# Patient Record
Sex: Male | Born: 1995 | Race: Black or African American | Hispanic: No | Marital: Single | Smoking: Never smoker
Health system: Southern US, Community
[De-identification: ages and names within clinical notes are randomized; demographics above are authoritative.]

## PROBLEM LIST (undated history)

## (undated) DIAGNOSIS — J45909 Unspecified asthma, uncomplicated: Secondary | ICD-10-CM

## (undated) DIAGNOSIS — R040 Epistaxis: Secondary | ICD-10-CM

## (undated) DIAGNOSIS — Z789 Other specified health status: Secondary | ICD-10-CM

## (undated) DIAGNOSIS — N200 Calculus of kidney: Secondary | ICD-10-CM

## (undated) DIAGNOSIS — K5900 Constipation, unspecified: Secondary | ICD-10-CM

## (undated) HISTORY — DX: Other specified health status: Z78.9

## (undated) HISTORY — DX: Epistaxis: R04.0

## (undated) HISTORY — DX: Calculus of kidney: N20.0

## (undated) HISTORY — DX: Constipation, unspecified: K59.00

---
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
## 2017-01-05 ENCOUNTER — Encounter: Payer: Self-pay | Admitting: Dermatology

## 2017-01-05 ENCOUNTER — Ambulatory Visit: Payer: No Typology Code available for payment source | Attending: Oncology | Admitting: Dermatology

## 2017-01-05 VITALS — BP 118/72 | Ht 66.5 in | Wt 134.0 lb

## 2017-01-05 DIAGNOSIS — D229 Melanocytic nevi, unspecified: Secondary | ICD-10-CM

## 2017-01-05 DIAGNOSIS — L219 Seborrheic dermatitis, unspecified: Secondary | ICD-10-CM

## 2017-01-05 MED ORDER — FLUOCINOLONE ACETONIDE SCALP 0.01 % EX OIL *I*
TOPICAL_OIL | CUTANEOUS | 1 refills | Status: DC
Start: 2017-01-06 — End: 2017-06-21

## 2017-01-05 MED ORDER — KETOCONAZOLE 2 % EX SHAM *I*
MEDICATED_SHAMPOO | CUTANEOUS | 2 refills | Status: DC
Start: 2017-01-07 — End: 2017-06-21

## 2017-01-05 NOTE — Progress Notes (Addendum)
Referring Provider: No ref. provider found   PCP: Howell Pringle, MD     Chief complaint: New Patient Visit (dry scalp )    Derm History:   -cradle cap as an infant    HPI:   Mark Bautista is a pleasant 21 y.o. male accompanied by his mother here for the following:    Problem: scaling  Location: scalp  Duration: 1 year  Associated signs/symptoms: itchy, red, tender  Modifying factors (prior treatments/current treatment):   -head and shoulders shampoo - not helpful (will go away for about 1 day at most)  -nizoral shampoo - not helpful (will go away for about 1 day at most)  -endorses headaches   -no other areas of involvement of skin    Personal hx of skin cancer: no  Family hx: no family history of psoriasis  Social History: Never Smoker    ROS:   Constitutional: Denies fevers, chills, weight loss  Integumentary: No new or changing moles    Physical Exam:    Vitals:    01/05/17 1257   BP: 118/72   Weight: 60.8 kg (134 lb)   Height: 1.689 m (5' 6.5")      Pain    01/05/17 1257   PainSc:   0 - No pain      General: Awake and alert, NAD  Skin: All of the following were examined, and were within normal limits, except as noted: Face, Ears, Scalp/Hair, Eyes/Eyelids, Lips, Neck and Extremities (RUE/LUE)  -over the vertex scalp > parietal scalp there is thick white-yellow scaling with minimal underlying erythema; the scalp is diffusely tender to the touch  -over the central upper back is a brown patch with darker brown macules contained within  -scattered over the upper back are several hyperpigmented macules consistent with post inflammatory hyperpigmentation    Lymph nodes: no submental, submandibular, cervical, postauricular, supraclavicular lymphadenopathy    Assessment/Plan:   1. Dandruff/ minimalSeborrheic dermatitis - scalp   -diagnosis and treatment options discussed  -tenderness is not explained by skin findings today - no significant inflammation/erythema or ulceration. No lymphadenopathy.  -given concurrent  headaches, recommend patient follow up with pediatrician for further investigation  -start derma-smoothe scalp oil. Leave on overnight. Try to do this 3 nights per week.  -start ketoconazole 2% shampoo. Leave on 5-10 minutes prior to rinsing off. Use this 2-3 times per week.    2. Nevus spilus - upper back  -diagnosis discussed  -no treatment necessary    Barriers to learning: None     Return to Clinic: 3 months    Patient seen with Dr. Dois Davenport, MD  Dermatology Resident  Agree with note of Dr.Travis. Patient evaluated jointly.  Would manage as noted.    Octaviano Glow, MD

## 2017-01-05 NOTE — Patient Instructions (Signed)
Seborrheic dermatitis - scalp   -diagnosis and treatment options discussed  -tenderness is not explained by skin findings today - no significant inflammation/erythema or ulceration. No lymphadenopathy.  -given concurrent headaches, recommend patient follow up with pediatrician for further investigation  -start derma-smoothe scalp oil. Leave on overnight. Try to do this 3 nights per week.  -start ketoconazole 2% shampoo. Leave on 5-10 minutes prior to rinsing off. Use this 2-3 times per week.

## 2017-04-12 ENCOUNTER — Emergency Department (HOSPITAL_COMMUNITY): Payer: BLUE CROSS/BLUE SHIELD

## 2017-04-12 ENCOUNTER — Other Ambulatory Visit: Payer: Self-pay

## 2017-04-12 ENCOUNTER — Emergency Department (HOSPITAL_COMMUNITY)
Admission: EM | Admit: 2017-04-12 | Discharge: 2017-04-13 | Disposition: A | Discharge status: Home / Self Care | Payer: BLUE CROSS/BLUE SHIELD | Attending: Emergency Medicine | Admitting: Emergency Medicine

## 2017-04-12 ENCOUNTER — Encounter (HOSPITAL_COMMUNITY): Payer: Self-pay | Admitting: Emergency Medicine

## 2017-04-12 DIAGNOSIS — B349 Viral infection, unspecified: Secondary | ICD-10-CM | POA: Diagnosis not present

## 2017-04-12 DIAGNOSIS — Z79899 Other long term (current) drug therapy: Secondary | ICD-10-CM | POA: Insufficient documentation

## 2017-04-12 DIAGNOSIS — R55 Syncope and collapse: Secondary | ICD-10-CM | POA: Diagnosis present

## 2017-04-12 LAB — CK: Total CK: 165 U/L (ref 49–397)

## 2017-04-12 LAB — CBC
HCT: 45 % (ref 39.0–52.0)
Hemoglobin: 15.3 g/dL (ref 13.0–17.0)
MCH: 30.4 pg (ref 26.0–34.0)
MCHC: 34 g/dL (ref 30.0–36.0)
MCV: 89.5 fL (ref 78.0–100.0)
PLATELETS: 215 10*3/uL (ref 150–400)
RBC: 5.03 MIL/uL (ref 4.22–5.81)
RDW: 13.6 % (ref 11.5–15.5)
WBC: 9.2 10*3/uL (ref 4.0–10.5)

## 2017-04-12 LAB — URINALYSIS, ROUTINE W REFLEX MICROSCOPIC
BACTERIA UA: NONE SEEN
Bilirubin Urine: NEGATIVE
Glucose, UA: NEGATIVE mg/dL
HGB URINE DIPSTICK: NEGATIVE
Ketones, ur: 20 mg/dL — AB
Leukocytes, UA: NEGATIVE
NITRITE: NEGATIVE
Protein, ur: 100 mg/dL — AB
SPECIFIC GRAVITY, URINE: 1.035 — AB (ref 1.005–1.030)
pH: 6 (ref 5.0–8.0)

## 2017-04-12 LAB — BASIC METABOLIC PANEL
Anion gap: 9 (ref 5–15)
BUN: 14 mg/dL (ref 6–20)
CALCIUM: 9.5 mg/dL (ref 8.9–10.3)
CHLORIDE: 105 mmol/L (ref 101–111)
CO2: 27 mmol/L (ref 22–32)
CREATININE: 0.87 mg/dL (ref 0.61–1.24)
GFR calc Af Amer: >60 mL/min (ref >60)
GFR calc non Af Amer: >60 mL/min (ref >60)
Glucose, Bld: 92 mg/dL (ref 65–99)
Potassium: 4 mmol/L (ref 3.5–5.1)
SODIUM: 141 mmol/L (ref 135–145)

## 2017-04-12 LAB — RAPID URINE DRUG SCREEN, HOSP PERFORMED
AMPHETAMINES: NOT DETECTED
Barbiturates: NOT DETECTED
Benzodiazepines: NOT DETECTED
COCAINE: NOT DETECTED
OPIATES: NOT DETECTED
TETRAHYDROCANNABINOL: NOT DETECTED

## 2017-04-12 LAB — CBG MONITORING, ED: GLUCOSE-CAPILLARY: 81 mg/dL (ref 65–99)

## 2017-04-12 MED ORDER — IBUPROFEN 200 MG PO TABS
600.0000 mg | ORAL_TABLET | Freq: Once | ORAL | Status: AC
  Administered 2017-04-12: 600 mg via ORAL
  Filled 2017-04-12: qty 3

## 2017-04-12 NOTE — ED Provider Notes (Signed)
ECG shows normal sinus tachycardia with a rate of 106, no ectopy. Normal axis. Normal P wave. Normal QRS. Normal intervals. Normal ST and T waves. Impression: sinus tachycardia, otherwise normal ECG. No prior ECG available for comparison.    Dione Booze, MD 04/12/17 2117

## 2017-04-12 NOTE — ED Notes (Signed)
Pt reported having a syncopal episode today at his dorm that was witnessed by his roommate. Up to it happening, he said he wasn't feeling weak. Pt was seen at the [facility] health [facility] at [facility], who sent him here. Reports having a syncopal episode in high school.

## 2017-04-12 NOTE — ED Triage Notes (Signed)
Pt presents with family for evaluation of syncopal episode that occurred today. Pt was seen by clinic at [facility] and Flu/Mono was ruled out and pt continues to have body aches and weakness.

## 2017-04-13 ENCOUNTER — Encounter (HOSPITAL_COMMUNITY): Payer: Self-pay | Admitting: Emergency Medicine

## 2017-04-13 NOTE — ED Provider Notes (Signed)
[facility]-EMERGENCY DEPT Provider Note   CSN: [id number] Arrival date & time: 04/12/17  1827     History   Chief Complaint Chief Complaint  Patient presents with  . Loss of Consciousness    HPI Charles Horn is a 21 y.o. male.  The history is provided by the patient. No language interpreter was used.  Loss of Consciousness   This is a new problem. The current episode started 6 to 12 hours ago. The problem occurs constantly. The problem has been resolved. He lost consciousness for a period of less than one minute. Associated with: lying down viral illness. Pertinent negatives include abdominal pain, back pain, bladder incontinence, bowel incontinence, chest pain, clumsiness, confusion, congestion, diaphoresis, dizziness, fever, focal sensory loss, focal weakness, headaches, light-headedness, malaise/fatigue, nausea, palpitations, seizures, slurred speech, vertigo, visual change, vomiting and weakness. He has tried nothing for the symptoms. The treatment provided significant relief.    History reviewed. No pertinent past medical history.  There are no active problems to display for this patient.   History reviewed. No pertinent surgical history.     Home Medications    Prior to Admission medications   Medication Sig Start Date End Date Taking? Authorizing Provider  albuterol (PROVENTIL HFA;VENTOLIN HFA) 108 (90 Base) MCG/ACT inhaler Inhale 1-2 puffs into the lungs every 6 (six) hours as needed for wheezing or shortness of breath.   Yes [provider]  albuterol (PROVENTIL) (2.5 MG/3ML) 0.083% nebulizer solution Take 2.5 mg by nebulization every 6 (six) hours as needed for wheezing or shortness of breath.   Yes [provider]    Family History History reviewed. No pertinent family history.  Social History Social History  Substance Use Topics  . Smoking status: Never Smoker  . Smokeless tobacco: Never Used  . Alcohol use No     Allergies     Patient has no known allergies.   Review of Systems Review of Systems  Constitutional: Negative for diaphoresis, fever and malaise/fatigue.  HENT: Negative for congestion, sinus pain, sore throat and voice change.   Respiratory: Negative for chest tightness and shortness of breath.   Cardiovascular: Positive for syncope. Negative for chest pain and palpitations.  Gastrointestinal: Negative for abdominal pain, bowel incontinence, nausea and vomiting.  Genitourinary: Negative for bladder incontinence and hematuria.  Musculoskeletal: Negative for back pain, neck pain and neck stiffness.  Skin: Negative for rash.  Neurological: Negative for dizziness, vertigo, focal weakness, seizures, weakness, light-headedness and headaches.  Psychiatric/Behavioral: Negative for confusion.  All other systems reviewed and are negative.    Physical Exam Updated Vital Signs BP 100/80 (BP Location: Left Arm)   Pulse 100   Temp 98.4 F (36.9 C) (Oral)   Resp 18   Ht  (1.651 m)   Wt 62.2 kg (137 lb 3.2 oz)   SpO2 100%   BMI 22.83 kg/m   Physical Exam  Constitutional: He is oriented to person, place, and time. He appears well-developed and well-nourished. No distress.  HENT:  Head: Normocephalic and atraumatic.  Nose: Nose normal.  Mouth/Throat: No oropharyngeal exudate.  Eyes: Pupils are equal, round, and reactive to light. Conjunctivae and EOM are normal.  Neck: Normal range of motion. Neck supple.  Cardiovascular: Normal rate, regular rhythm, normal heart sounds and intact distal pulses.   Pulmonary/Chest: Effort normal and breath sounds normal. He has no wheezes. He has no rales.  Abdominal: Soft. Bowel sounds are normal. He exhibits no mass. There is no tenderness. There is  no rebound and no guarding.  Musculoskeletal: Normal range of motion. He exhibits no edema or tenderness.  Neurological: He is alert and oriented to person, place, and time. He displays normal reflexes.  Skin: Skin  is warm and dry. Capillary refill takes less than 2 seconds.     ED Treatments / Results   Vitals:   04/12/17 1942  BP: 100/80  Pulse: 100  Resp: 18  Temp: 98.4 F (36.9 C)  SpO2: 100%    Labs (all labs ordered are listed, but only abnormal results are displayed)  Results for orders placed or performed during the hospital encounter of 04/12/17  Basic metabolic panel  Result Value Ref Range   Sodium 141 135 - 145 mmol/L   Potassium 4.0 3.5 - 5.1 mmol/L   Chloride 105 101 - 111 mmol/L   CO2 27 22 - 32 mmol/L   Glucose, Bld 92 65 - 99 mg/dL   BUN 14 6 - 20 mg/dL   Creatinine, Ser 4.09 0.61 - 1.24 mg/dL   Calcium 9.5 8.9 - 81.1 mg/dL   GFR calc non Af Amer >60 >60 mL/min   GFR calc Af Amer >60 >60 mL/min   Anion gap 9 5 - 15  CBC  Result Value Ref Range   WBC 9.2 4.0 - 10.5 K/uL   RBC 5.03 4.22 - 5.81 MIL/uL   Hemoglobin 15.3 13.0 - 17.0 g/dL   HCT 91.4 78.2 - 95.6 %   MCV 89.5 78.0 - 100.0 fL   MCH 30.4 26.0 - 34.0 pg   MCHC 34.0 30.0 - 36.0 g/dL   RDW 21.3 08.6 - 57.8 %   Platelets 215 150 - 400 K/uL  CK  Result Value Ref Range   Total CK 165 49 - 397 U/L  Urinalysis, Routine w reflex microscopic  Result Value Ref Range   Color, Urine YELLOW YELLOW   APPearance HAZY (A) CLEAR   Specific Gravity, Urine 1.035 (H) 1.005 - 1.030   pH 6.0 5.0 - 8.0   Glucose, UA NEGATIVE NEGATIVE mg/dL   Hgb urine dipstick NEGATIVE NEGATIVE   Bilirubin Urine NEGATIVE NEGATIVE   Ketones, ur 20 (A) NEGATIVE mg/dL   Protein, ur 469 (A) NEGATIVE mg/dL   Nitrite NEGATIVE NEGATIVE   Leukocytes, UA NEGATIVE NEGATIVE   RBC / HPF 0-5 0 - 5 RBC/hpf   WBC, UA 0-5 0 - 5 WBC/hpf   Bacteria, UA NONE SEEN NONE SEEN   Squamous Epithelial / LPF 0-5 (A) NONE SEEN   Mucus PRESENT   Rapid urine drug screen (hospital performed)  Result Value Ref Range   Opiates NONE DETECTED NONE DETECTED   Cocaine NONE DETECTED NONE DETECTED   Benzodiazepines NONE DETECTED NONE DETECTED   Amphetamines NONE  DETECTED NONE DETECTED   Tetrahydrocannabinol NONE DETECTED NONE DETECTED   Barbiturates NONE DETECTED NONE DETECTED  CBG monitoring, ED  Result Value Ref Range   Glucose-Capillary 81 65 - 99 mg/dL   Dg Chest 2 View  Result Date: 04/12/2017 CLINICAL DATA:  Body aches EXAM: CHEST  2 VIEW COMPARISON:  None. FINDINGS: The heart size and mediastinal contours are within normal limits. Both lungs are clear. The visualized skeletal structures are unremarkable. IMPRESSION: No active cardiopulmonary disease. Electronically Signed   By: Jasmine Pang M.D.   On: 04/12/2017 23:56    EKG No IHHS nor Brugada   Radiology Dg Chest 2 View  Result Date: 04/12/2017 CLINICAL DATA:  Body aches EXAM: CHEST  2 VIEW COMPARISON:  None. FINDINGS: The heart size and mediastinal contours are within normal limits. Both lungs are clear. The visualized skeletal structures are unremarkable. IMPRESSION: No active cardiopulmonary disease. Electronically Signed   By: Jasmine Pang M.D.   On: 04/12/2017 23:56    Procedures Procedures (including critical care time)  Medications Ordered in ED Medications  ibuprofen (ADVIL,MOTRIN) tablet 600 mg (600 mg Oral Given 04/12/17 2220)     Final Clinical Impressions(s) / ED Diagnoses   Return for facial swelling, shortness of breath, swelling or the lips or tongue, chest pain, dyspnea on exertion, new weakness or numbness changes in vision or speech,  Inability to tolerate liquids or food, changes in voice cough, altered mental status or any concerns. No signs of systemic illness or infection. The patient is nontoxic-appearing on exam and vital signs are within normal limits.    I have reviewed the triage vital signs and the nursing notes. Pertinent labs &imaging results that were available during my care of the patient were reviewed by me and considered in my medical decision making (see chart for details).  After history, exam, and medical workup I feel the patient has  been appropriately medically screened and is safe for discharge home. Pertinent diagnoses were discussed with the patient. Patient was given return precautions.       Schneur Crowson, MD 04/13/17 0020

## 2017-04-25 ENCOUNTER — Encounter (HOSPITAL_COMMUNITY): Payer: Self-pay | Admitting: Emergency Medicine

## 2017-04-25 ENCOUNTER — Emergency Department (HOSPITAL_COMMUNITY)
Admission: EM | Admit: 2017-04-25 | Discharge: 2017-04-25 | Disposition: A | Discharge status: Home / Self Care | Payer: BLUE CROSS/BLUE SHIELD | Attending: Emergency Medicine | Admitting: Emergency Medicine

## 2017-04-25 DIAGNOSIS — L0201 Cutaneous abscess of face: Secondary | ICD-10-CM | POA: Insufficient documentation

## 2017-04-25 DIAGNOSIS — J45909 Unspecified asthma, uncomplicated: Secondary | ICD-10-CM | POA: Insufficient documentation

## 2017-04-25 DIAGNOSIS — R220 Localized swelling, mass and lump, head: Secondary | ICD-10-CM | POA: Diagnosis present

## 2017-04-25 DIAGNOSIS — L0291 Cutaneous abscess, unspecified: Secondary | ICD-10-CM

## 2017-04-25 HISTORY — DX: Unspecified asthma, uncomplicated: J45.909

## 2017-04-25 MED ORDER — SULFAMETHOXAZOLE-TRIMETHOPRIM 800-160 MG PO TABS: 1.0000 | ORAL_TABLET | Freq: Two times a day (BID) | ORAL | 0 refills | Status: AC

## 2017-04-25 MED ORDER — SULFAMETHOXAZOLE-TRIMETHOPRIM 800-160 MG PO TABS
1.0000 | ORAL_TABLET | Freq: Once | ORAL | Status: AC
Start: 2017-04-25 — End: 2017-04-25
  Administered 2017-04-25: 1 via ORAL
  Filled 2017-04-25: qty 1

## 2017-04-25 NOTE — ED Notes (Signed)
Declined W/C at D/C and was escorted to lobby by RN. 

## 2017-04-25 NOTE — ED Provider Notes (Signed)
[facility]-EMERGENCY DEPT Provider Note   CSN: [id number] Arrival date & time: 04/25/17  1528     History   Chief Complaint Chief Complaint  Patient presents with  . Abscess    under chin    HPI Charles Horn is a 21 y.o. male.  The history is provided by the patient.  Abscess  Size:  Chin swelling and pain Abscess quality: induration, painful and redness   Red streaking: no   Duration:  1 day Progression:  Worsening Pain details:    Quality:  Shooting   Severity:  Mild   Timing:  Constant   Progression:  Worsening Chronicity:  New Relieved by:  Nothing Worsened by:  Nothing Ineffective treatments:  None tried Associated symptoms: no fever     Past Medical History:  Diagnosis Date  . Asthma     There are no active problems to display for this patient.   History reviewed. No pertinent surgical history.     Home Medications    Prior to Admission medications   Medication Sig Start Date End Date Taking? Authorizing Provider  albuterol (PROVENTIL HFA;VENTOLIN HFA) 108 (90 Base) MCG/ACT inhaler Inhale 1-2 puffs into the lungs every 6 (six) hours as needed for wheezing or shortness of breath.    [provider]  albuterol (PROVENTIL) (2.5 MG/3ML) 0.083% nebulizer solution Take 2.5 mg by nebulization every 6 (six) hours as needed for wheezing or shortness of breath.    [provider]    Family History No family history on file.  Social History Social History  Substance Use Topics  . Smoking status: Never Smoker  . Smokeless tobacco: Never Used  . Alcohol use No     Allergies   Patient has no known allergies.   Review of Systems Review of Systems  Constitutional: Negative for fever.  All other systems reviewed and are negative.    Physical Exam Updated Vital Signs Ht  (1.651 m)   Wt 62.1 kg (137 lb)   BMI 22.80 kg/m   Physical Exam  Constitutional: He is oriented to person, place, and time. He appears  well-developed and well-nourished. No distress.  HENT:  Head: Normocephalic and atraumatic.    Eyes: Pupils are equal, round, and reactive to light. EOM are normal.  Cardiovascular: Normal rate.   Pulmonary/Chest: Effort normal.  Neurological: He is alert and oriented to person, place, and time.  Skin: Skin is warm. Capillary refill takes less than 2 seconds.  Psychiatric: He has a normal mood and affect. His behavior is normal.  Nursing note and vitals reviewed.    ED Treatments / Results  Labs (all labs ordered are listed, but only abnormal results are displayed) Labs Reviewed - No data to display  EKG  EKG Interpretation None       Radiology No results found.  Procedures Procedures (including critical care time)  Medications Ordered in ED Medications  sulfamethoxazole-trimethoprim (BACTRIM DS,SEPTRA DS) 800-160 MG per tablet 1 tablet (not administered)     Initial Impression / Assessment and Plan / ED Course  I have reviewed the triage vital signs and the nursing notes.  Pertinent labs & imaging results that were available during my care of the patient were reviewed by me and considered in my medical decision making (see chart for details).     Patient with small abscess on the chin with surrounding mild erythema and one palpable tender lymph node. He has no evidence of swelling or pain on the  inside of his mouth. No pain with swallowing and otherwise well-appearing. Patient will do warm soaks and small puncture with a needle here with small pus drainage. Patient started on Bactrim.  Final Clinical Impressions(s) / ED Diagnoses   Final diagnoses:  Abscess    New Prescriptions New Prescriptions   SULFAMETHOXAZOLE-TRIMETHOPRIM (BACTRIM DS,SEPTRA DS) 800-160 MG TABLET    Take 1 tablet by mouth 2 (two) times daily.     Gwyneth Sprout, MD 04/25/17 1754

## 2017-04-25 NOTE — ED Triage Notes (Signed)
Pt. Stated, I have a lump underneath my chin and another area under my chin

## 2017-06-11 ENCOUNTER — Ambulatory Visit: Payer: Self-pay | Admitting: Urology

## 2017-06-21 ENCOUNTER — Ambulatory Visit: Payer: Commercial Managed Care - PPO | Attending: Procedural Dermatology | Admitting: Dermatology

## 2017-06-21 ENCOUNTER — Encounter: Payer: Self-pay | Admitting: Dermatology

## 2017-06-21 VITALS — BP 120/72 | Ht 66.5 in | Wt 141.0 lb

## 2017-06-21 DIAGNOSIS — L210 Seborrhea capitis: Secondary | ICD-10-CM

## 2017-06-21 DIAGNOSIS — D229 Melanocytic nevi, unspecified: Secondary | ICD-10-CM

## 2017-06-21 DIAGNOSIS — L309 Dermatitis, unspecified: Secondary | ICD-10-CM

## 2017-06-21 MED ORDER — FLUOCINONIDE 0.05 % EX SOLN *I*
Freq: Two times a day (BID) | CUTANEOUS | 5 refills | Status: AC
Start: 2017-06-21 — End: ?

## 2017-06-21 MED ORDER — KETOCONAZOLE 2 % EX SHAM *I*
MEDICATED_SHAMPOO | CUTANEOUS | 11 refills | Status: AC
Start: 2017-06-21 — End: ?

## 2017-06-21 MED ORDER — FLUOCINOLONE ACETONIDE SCALP 0.01 % EX OIL *I*
TOPICAL_OIL | CUTANEOUS | 3 refills | Status: AC
Start: 2017-06-21 — End: ?

## 2017-06-21 NOTE — Progress Notes (Addendum)
Referring Provider: No ref. provider found   PCP: Mark Pringle, MD     Chief complaint: Follow-up (peeling skin hands)    Derm History:   -cradle cap as an infant    HPI:   Mark Bautista is a pleasant 21 y.o. male accompanied by his mother here for the following:    Problem: seborrheic dermatitis   Location: scalp  Duration: 1 year  Associated signs/symptoms: itchy, red  Modifying factors (prior treatments/current treatment):   Started on dermasmoothe oil and ketoconazole shampoo at last visit with much improvement, but he still flares once in a while   States headaches he previously had resolved     Problem: peeling rash   Location: bilateral palms   Duration: started about 1 month ago, improved over last week   Associated signs/symptoms: Itching  Modifying factors (prior treatments/current treatment):   Mom states rash improved with bactroban     Personal hx of skin cancer: no  Family hx: no family history of psoriasis  Social History: Never Smoker    ROS:   Constitutional: Denies fevers, chills, weight loss  Integumentary: No new or changing moles    Physical Exam:    Vitals:    06/21/17 1029   BP: 120/72   Weight: 64 kg (141 lb)   Height: 1.689 m (5' 6.5")      Pain    06/21/17 1028   PainSc:   0 - No pain      General: Awake and alert, NAD  Skin: All of the following were examined, and were within normal limits, except as noted: Face, Ears, Scalp/Hair, Eyes/Eyelids, Lips, Neck and Extremities (RUE/LUE)  Mild white scaling of scalp   Over the central upper back is a brown patch with darker brown macules contained within  Scattered over the upper back are several hyperpigmented macules consistent with post inflammatory hyperpigmentation  Hands are clear on exam today, no evidence of rash    Assessment/Plan:   1. Dandruff/ minimalSeborrheic dermatitis - scalp   -diagnosis and treatment options discussed  -headaches had improved per patient   -continue derma-smoothe scalp oil. Leave on overnight. Try to do  this 3 nights per week.  - For small areas of itching, apply a couple drops of Lidex solution 1-2 times per day as needed  -continue ketoconazole 2% shampoo. Leave on 5-10 minutes prior to rinsing off. Use this 2-3 times per week.    2. Nevus spilus - upper back  -diagnosis discussed  -no treatment necessary    3. Hand dermatitis, possibly a contact dermatitis   - start using white soap in the shower like Dove   - stop bactroban  - Use moisturizer daily to dry skin. Recommended products are Cerave, Cetaphil, Aquaphor, or Vaseline. Creams are better moisturizers than lotions. Creams are usually purchased in a large tub.     Barriers to learning: None     Return to Clinic: PRN     Patient seen with Dr. Karen Kitchens, MD  Dermatology Resident   06/21/17  10:33 AM     Agree with note of Dr.Kuehn. Patient evaluated jointly.  Would manage as noted.    Octaviano Glow, MD

## 2017-06-21 NOTE — Patient Instructions (Addendum)
Hand dermatitis, possibly a contact dermatitis   - start using white soap in the shower like Dove   - stop bactroban  - Use moisturizer daily to dry skin. Recommended products are Cerave, Cetaphil, Aquaphor, or Vaseline. Creams are better moisturizers than lotions. Creams are usually purchased in a large tub.     Dandruff/ minimalSeborrheic dermatitis - scalp   -diagnosis and treatment options discussed  -headaches had improved per patient   -continue derma-smoothe scalp oil. Leave on overnight. Try to do this 3 nights per week.  - For small areas of itching, apply a couple drops of Lidex solution 1-2 times per day as needed  -continue ketoconazole 2% shampoo. Leave on 5-10 minutes prior to rinsing off. Use this 2-3 times per week.

## 2017-07-14 ENCOUNTER — Other Ambulatory Visit: Payer: No Typology Code available for payment source

## 2017-07-14 ENCOUNTER — Ambulatory Visit: Payer: No Typology Code available for payment source | Admitting: Radiology

## 2017-07-16 ENCOUNTER — Telehealth: Payer: Self-pay | Admitting: Urology

## 2017-07-16 NOTE — Telephone Encounter (Signed)
Staff,    Please call patient & remind him to have 24 hour urine & Renal US done prior to 07/23/17 appointment with Dr Gar Gibbon. Orders are in.  Please follow up with scheduling/rescheduing if necessary.    Thanks.

## 2017-07-19 NOTE — Telephone Encounter (Signed)
Mr. Platte is calling to cancel his appointment which is currently scheduled for 1/11.    Has the appointment been cancelled? yes    Has the appointment been rescheduled? no     Does the patient need a call back to reschedule?no    Patient can be contacted back at [phone]

## 2017-07-19 NOTE — Telephone Encounter (Signed)
Left message on voicemail for patient to call the office. Need to know if the Ultrasound and 24hr urine was done elsewhere, If not it needs to be scheduled and appointment has to be rescheduled

## 2017-07-23 ENCOUNTER — Ambulatory Visit: Payer: Self-pay | Admitting: Urology

## 2017-08-23 ENCOUNTER — Ambulatory Visit: Payer: Self-pay | Admitting: Urology

## 2017-11-27 ENCOUNTER — Other Ambulatory Visit: Payer: Self-pay

## 2017-11-27 ENCOUNTER — Emergency Department (HOSPITAL_COMMUNITY)
Admission: EM | Admit: 2017-11-27 | Discharge: 2017-11-27 | Disposition: A | Discharge status: Home / Self Care | Payer: BLUE CROSS/BLUE SHIELD | Attending: Emergency Medicine | Admitting: Emergency Medicine

## 2017-11-27 ENCOUNTER — Encounter (HOSPITAL_COMMUNITY): Payer: Self-pay | Admitting: Emergency Medicine

## 2017-11-27 ENCOUNTER — Emergency Department (HOSPITAL_COMMUNITY): Payer: BLUE CROSS/BLUE SHIELD

## 2017-11-27 DIAGNOSIS — R197 Diarrhea, unspecified: Secondary | ICD-10-CM | POA: Diagnosis not present

## 2017-11-27 DIAGNOSIS — R1110 Vomiting, unspecified: Secondary | ICD-10-CM

## 2017-11-27 DIAGNOSIS — R1031 Right lower quadrant pain: Secondary | ICD-10-CM | POA: Insufficient documentation

## 2017-11-27 DIAGNOSIS — J45909 Unspecified asthma, uncomplicated: Secondary | ICD-10-CM | POA: Diagnosis not present

## 2017-11-27 LAB — COMPREHENSIVE METABOLIC PANEL
ALBUMIN: 4.3 g/dL (ref 3.5–5.0)
ALT: 39 U/L (ref 17–63)
ANION GAP: 10 (ref 5–15)
AST: 31 U/L (ref 15–41)
Alkaline Phosphatase: 55 U/L (ref 38–126)
BILIRUBIN TOTAL: 0.6 mg/dL (ref 0.3–1.2)
BUN: 9 mg/dL (ref 6–20)
CO2: 22 mmol/L (ref 22–32)
Calcium: 9.8 mg/dL (ref 8.9–10.3)
Chloride: 105 mmol/L (ref 101–111)
Creatinine, Ser: 0.99 mg/dL (ref 0.61–1.24)
GFR calc non Af Amer: >60 mL/min (ref >60)
GLUCOSE: 129 mg/dL — AB (ref 65–99)
Potassium: 3.3 mmol/L — ABNORMAL LOW (ref 3.5–5.1)
SODIUM: 137 mmol/L (ref 135–145)
TOTAL PROTEIN: 7.1 g/dL (ref 6.5–8.1)

## 2017-11-27 LAB — CBC
HEMATOCRIT: 47.7 % (ref 39.0–52.0)
Hemoglobin: 16.2 g/dL (ref 13.0–17.0)
MCH: 29.4 pg (ref 26.0–34.0)
MCHC: 34 g/dL (ref 30.0–36.0)
MCV: 86.6 fL (ref 78.0–100.0)
Platelets: 278 10*3/uL (ref 150–400)
RBC: 5.51 MIL/uL (ref 4.22–5.81)
RDW: 12.3 % (ref 11.5–15.5)
WBC: 5.8 10*3/uL (ref 4.0–10.5)

## 2017-11-27 LAB — URINALYSIS, ROUTINE W REFLEX MICROSCOPIC
Bilirubin Urine: NEGATIVE
Glucose, UA: NEGATIVE mg/dL
Hgb urine dipstick: NEGATIVE
KETONES UR: NEGATIVE mg/dL
LEUKOCYTES UA: NEGATIVE
Nitrite: NEGATIVE
PROTEIN: NEGATIVE mg/dL
Specific Gravity, Urine: 1.018 (ref 1.005–1.030)
pH: 7 (ref 5.0–8.0)

## 2017-11-27 LAB — LIPASE, BLOOD: Lipase: 22 U/L (ref 11–51)

## 2017-11-27 MED ORDER — KETOROLAC TROMETHAMINE 15 MG/ML IJ SOLN
15.0000 mg | Freq: Once | INTRAMUSCULAR | Status: AC
  Administered 2017-11-27: 15 mg via INTRAVENOUS
  Filled 2017-11-27: qty 1

## 2017-11-27 MED ORDER — ONDANSETRON 4 MG PO TBDP: ORAL_TABLET | ORAL | 0 refills | Status: AC

## 2017-11-27 MED ORDER — IOHEXOL 300 MG/ML  SOLN
100.0000 mL | Freq: Once | INTRAMUSCULAR | Status: AC | PRN
  Administered 2017-11-27: 100 mL via INTRAVENOUS

## 2017-11-27 NOTE — ED Triage Notes (Signed)
Pt's mom bought him a cookie dough blizzard from [employer] and then about 2 hours later started having diffuse abdominal pain with tenderness to the right side. Pt has had 1 episode of vomiting.  Pt currently having chills.

## 2017-11-27 NOTE — ED Notes (Signed)
Patient transported to CT 

## 2017-11-27 NOTE — ED Provider Notes (Signed)
[facility] EMERGENCY DEPARTMENT Provider Note   CSN: [id number] Arrival date & time: 11/27/17  5784     History   Chief Complaint Chief Complaint  Patient presents with  . Abdominal Pain    HPI Charles Horn is a 22 y.o. male.  Patient with asthma history no significant medical or surgical problems otherwise presents with gradually worsening right lower quadrant pain. He felt a surge 2 hours after having a blizzard from [employer]. No results had similar. No fevers or chills. Gen. Cramping.     Past Medical History:  Diagnosis Date  . Asthma     There are no active problems to display for this patient.   History reviewed. No pertinent surgical history.      Home Medications    Prior to Admission medications   Not on File    Family History No family history on file.  Social History Social History   Tobacco Use  . Smoking status: Never Smoker  . Smokeless tobacco: Never Used  Substance Use Topics  . Alcohol use: No  . Drug use: No     Allergies   Bactrim [sulfamethoxazole-trimethoprim]   Review of Systems Review of Systems  Constitutional: Negative for chills and fever.  HENT: Negative for congestion.   Eyes: Negative for visual disturbance.  Respiratory: Negative for shortness of breath.   Cardiovascular: Negative for chest pain.  Gastrointestinal: Positive for abdominal pain (mild RLQ). Negative for vomiting.  Genitourinary: Negative for dysuria and flank pain.  Musculoskeletal: Negative for back pain, neck pain and neck stiffness.  Skin: Negative for rash.  Neurological: Negative for light-headedness and headaches.     Physical Exam Updated Vital Signs BP (!) 123/54   Pulse 90   Temp 98.2 F (36.8 C) (Oral)   Resp 16   SpO2 100%   Physical Exam  Constitutional: He is oriented to person, place, and time. He appears well-developed and well-nourished.  HENT:  Head: Normocephalic and atraumatic.    Eyes: Conjunctivae are normal. Right eye exhibits no discharge. Left eye exhibits no discharge.  Neck: Normal range of motion. Neck supple. No tracheal deviation present.  Cardiovascular: Normal rate and regular rhythm.  Pulmonary/Chest: Effort normal and breath sounds normal.  Abdominal: Soft. He exhibits no distension. There is tenderness (mild RLQ tenderness). There is no guarding.  Musculoskeletal: He exhibits no edema.  Neurological: He is alert and oriented to person, place, and time.  Skin: Skin is warm. No rash noted.  Psychiatric: He has a normal mood and affect.  Nursing note and vitals reviewed.    ED Treatments / Results  Labs (all labs ordered are listed, but only abnormal results are displayed) Labs Reviewed  COMPREHENSIVE METABOLIC PANEL - Abnormal; Notable for the following components:      Result Value   Potassium 3.3 (*)    Glucose, Bld 129 (*)    All other components within normal limits  LIPASE, BLOOD  CBC  URINALYSIS, ROUTINE W REFLEX MICROSCOPIC    EKG None  Radiology Ct Abdomen Pelvis W Contrast  Result Date: 11/27/2017 CLINICAL DATA:  Acute onset of right lower quadrant abdominal pain. EXAM: CT ABDOMEN AND PELVIS WITH CONTRAST TECHNIQUE: Multidetector CT imaging of the abdomen and pelvis was performed using the standard protocol following bolus administration of intravenous contrast. CONTRAST:  OMNIPAQUE IOHEXOL 300 MG/ML  SOLN COMPARISON:  None. FINDINGS: Lower chest: The visualized lung bases are grossly clear. The visualized portions of the mediastinum  are unremarkable. Hepatobiliary: The liver is unremarkable in appearance. The gallbladder is unremarkable in appearance. The common bile duct remains normal in caliber. Pancreas: The pancreas is within normal limits. Spleen: The spleen is unremarkable in appearance. Adrenals/Urinary Tract: The adrenal glands are unremarkable in appearance. The kidneys are within normal limits. There is no evidence of  hydronephrosis. No renal or ureteral stones are identified. No perinephric stranding is seen. Stomach/Bowel: The stomach is unremarkable in appearance. The small bowel is within normal limits. The appendix is normal in caliber, without evidence of appendicitis. Mild wall thickening along the descending colon could reflect a mild infectious or inflammatory process. Vascular/Lymphatic: The abdominal aorta is unremarkable in appearance. The inferior vena cava is grossly unremarkable. No retroperitoneal lymphadenopathy is seen. No pelvic sidewall lymphadenopathy is identified. Reproductive: The bladder is mildly distended and grossly unremarkable. The prostate remains normal in size. Other: No additional soft tissue abnormalities are seen. Musculoskeletal: No acute osseous abnormalities are identified. The visualized musculature is unremarkable in appearance. IMPRESSION: 1. Mild wall thickening along the descending colon could reflect a mild infectious or inflammatory process. 2. Appendix unremarkable in appearance. Electronically Signed   By: Roanna Raider M.D.   On: 11/27/2017 23:28    Procedures Procedures (including critical care time)  Medications Ordered in ED Medications  ketorolac (TORADOL) 15 MG/ML injection 15 mg (15 mg Intravenous Given 11/27/17 2241)  iohexol (OMNIPAQUE) 300 MG/ML solution 100 mL (100 mLs Intravenous Contrast Given 11/27/17 2306)     Initial Impression / Assessment and Plan / ED Course  I have reviewed the triage vital signs and the nursing notes.  Pertinent labs & imaging results that were available during my care of the patient were reviewed by me and considered in my medical decision making (see chart for details).    Patient presents with mild right lower quadrant tenderness. Discussed early appendicitis versus viral/food toxin mediated. With persistent pain shared decision making for follow-up tomorrow versus CT scan, patient and mother side on CT scan this evening.  Pending results CT scan shows mild inflammation in the colon appendix normal. Patient stable for outpatient follow-up.   Final Clinical Impressions(s) / ED Diagnoses   Final diagnoses:  Vomiting and diarrhea  Right lower quadrant abdominal pain    ED Discharge Orders    None       Blane Ohara, MD 11/27/17 2343

## 2017-11-27 NOTE — ED Notes (Signed)
Pt returned to hallway from CT 

## 2017-11-27 NOTE — ED Notes (Signed)
ED Provider at bedside. 

## 2017-11-27 NOTE — Discharge Instructions (Addendum)
Take Zofran as needed for nausea and vomiting. Take Tylenol and Motrin as needed for pain.

## 2018-05-18 ENCOUNTER — Other Ambulatory Visit: Payer: Self-pay | Admitting: Family Medicine

## 2018-05-18 DIAGNOSIS — R609 Edema, unspecified: Secondary | ICD-10-CM

## 2018-05-18 DIAGNOSIS — M545 Low back pain, unspecified: Secondary | ICD-10-CM

## 2018-05-21 ENCOUNTER — Ambulatory Visit
Admission: RE | Admit: 2018-05-21 | Discharge: 2018-05-21 | Disposition: A | Discharge status: Home / Self Care | Payer: BLUE CROSS/BLUE SHIELD | Source: Ambulatory Visit | Attending: Family Medicine | Admitting: Family Medicine

## 2018-05-21 DIAGNOSIS — R609 Edema, unspecified: Secondary | ICD-10-CM

## 2018-05-21 DIAGNOSIS — M545 Low back pain, unspecified: Secondary | ICD-10-CM

## 2020-08-23 IMAGING — MR MR LUMBAR SPINE W/O CM
5 series · 47 of 48 positions shown · non-contrast
Comparison: CT abdomen 11/27/2017

CLINICAL DATA: Severe low back pain with pain radiating to the
right leg over the last week.

EXAM:
MRI LUMBAR SPINE WITHOUT CONTRAST
TECHNIQUE: Multiplanar, multisequence MR imaging of the lumbar spine was
performed. No intravenous contrast was administered.

[Series 2: T1 · sagittal · 4.0mm · 0.88mm/px · 5 of 13 slices shown (1 of 2)]
[im 1/13]
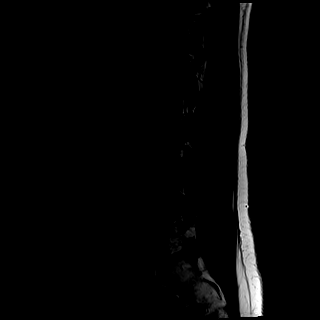
[im 4/13]
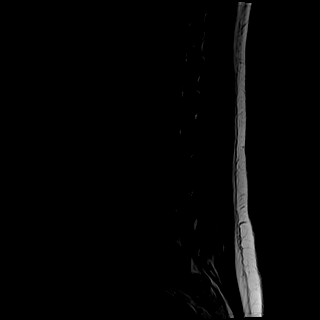
[im 7/13]
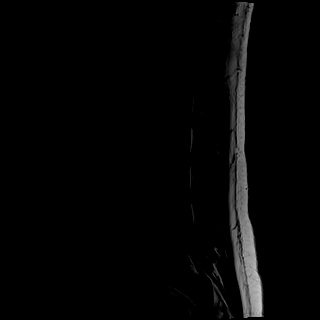
[im 10/13]
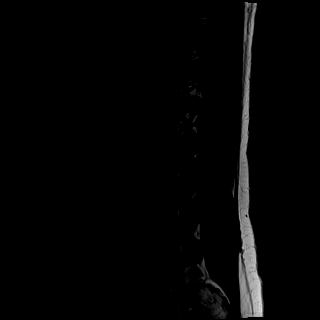
[im 13/13]
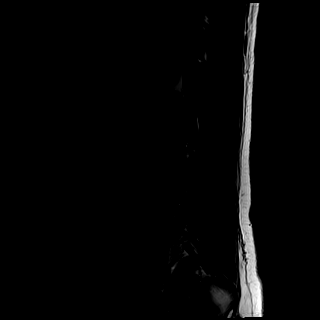

[Series 3: T2 · sagittal · 4.0mm · 0.88mm/px · 5 of 13 slices shown (1 of 2)]
[im 1/13]
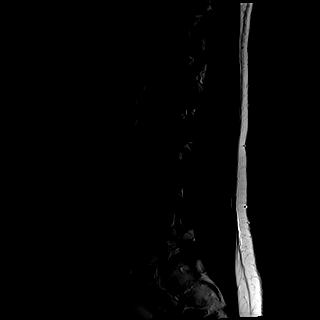
[im 4/13]
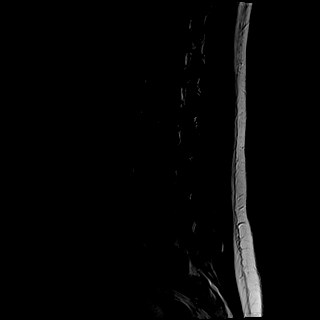
[im 7/13]
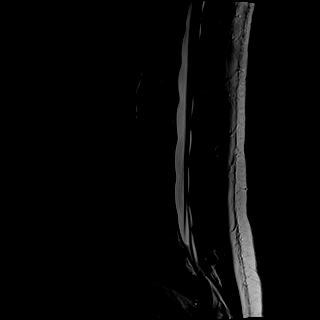
[im 10/13]
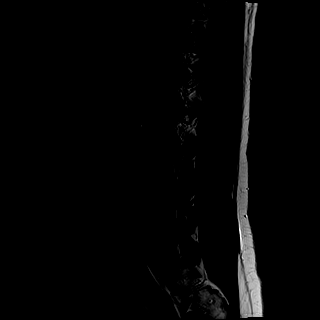
[im 13/13]
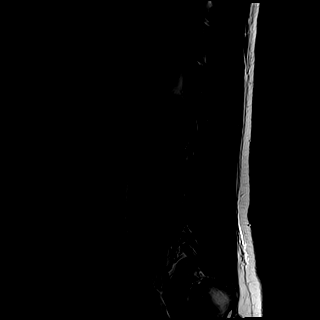

[Series 4: tirm sag · sagittal · 4.0mm · 0.55mm/px · 6 of 13 slices shown]
[im 1/13]
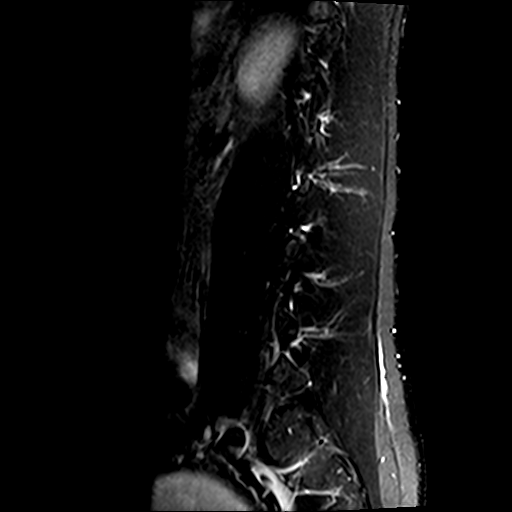
[im 3/13]
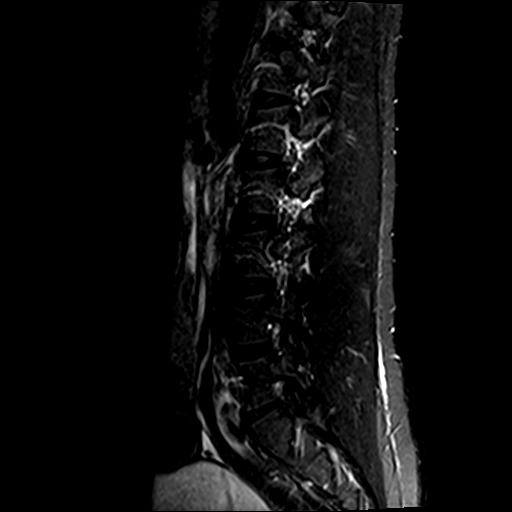
[im 5/13]
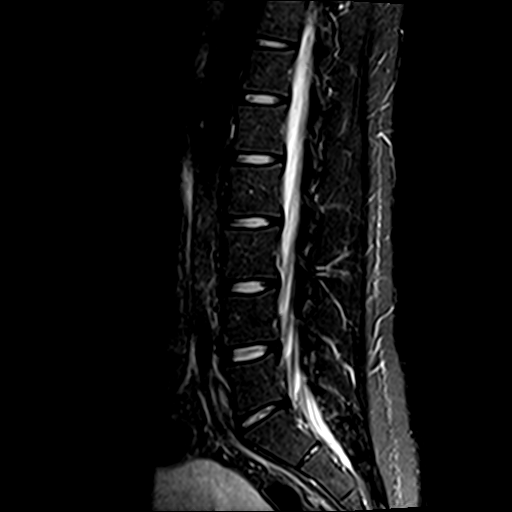
[im 8/13]
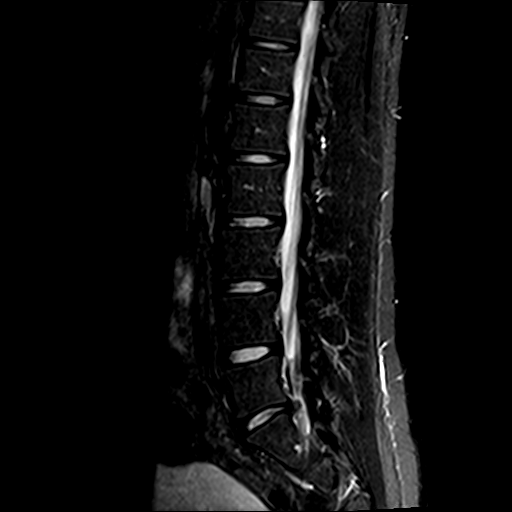
[im 10/13]
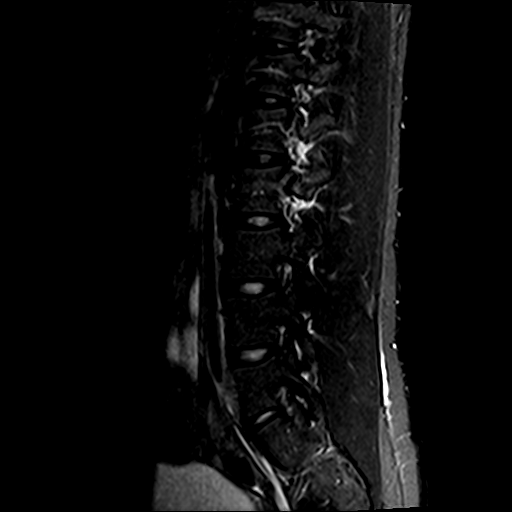
[im 13/13]
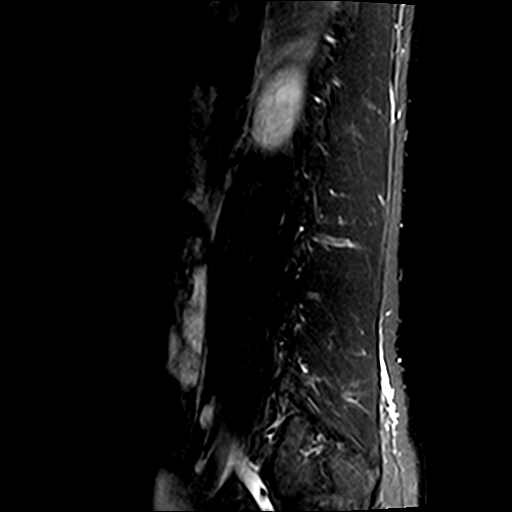

[Series 5: T1 · axial · 4.0mm · 0.78mm/px · z∈[-120,+86]mm · 15 of 37 slices shown (2 of 2)]
[im 1/37]
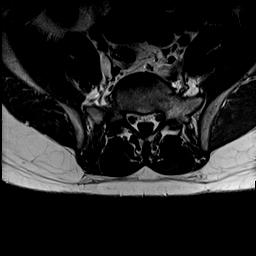
[im 3/37]
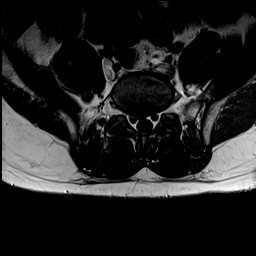
[im 5/37]
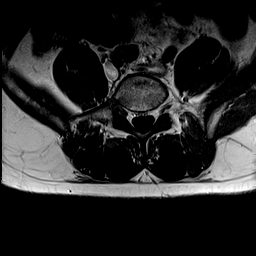
[im 8/37]
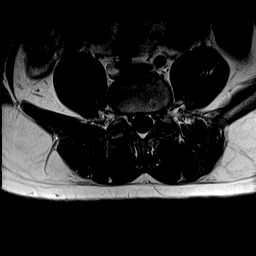
[im 10/37]
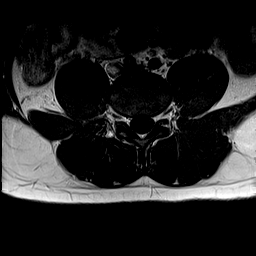
[im 13/37]
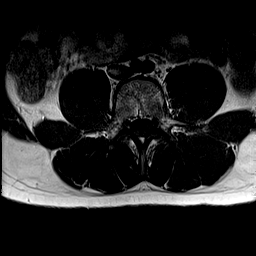
[im 15/37]
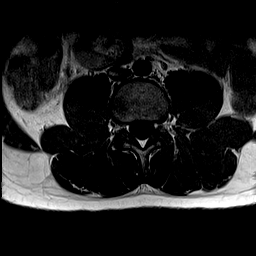
[im 17/37]
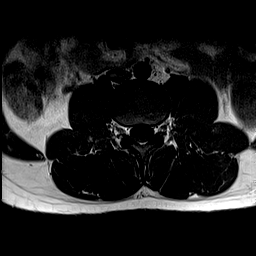
[im 20/37]
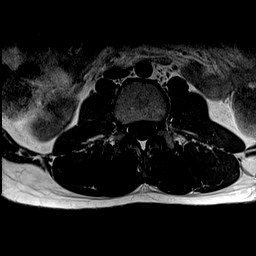
[im 22/37]
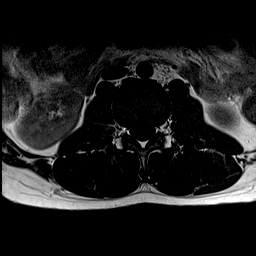
[im 25/37]
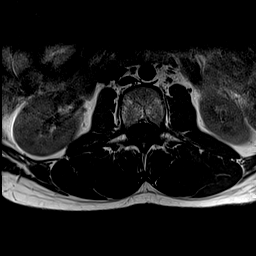
[im 27/37]
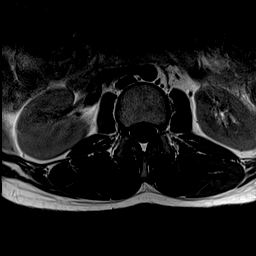
[im 29/37]
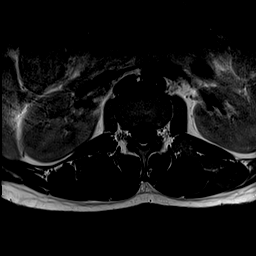
[im 32/37]
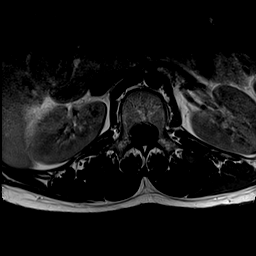
[im 37/37]
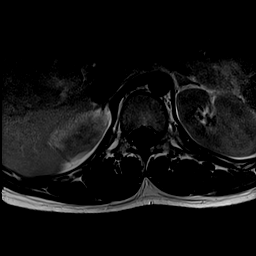

[Series 6: T2 · axial · 4.0mm · 0.78mm/px · z∈[-120,+86]mm · 16 of 37 slices shown (2 of 2)]
[im 1/37]
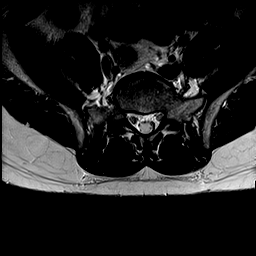
[im 3/37]
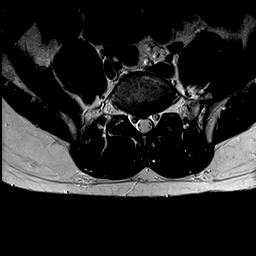
[im 5/37]
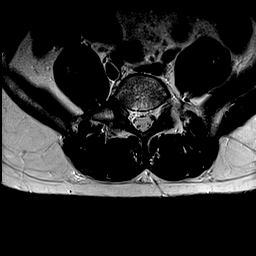
[im 8/37]
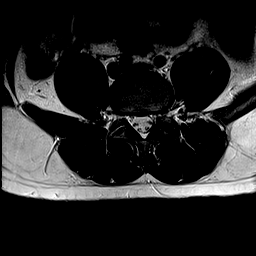
[im 10/37]
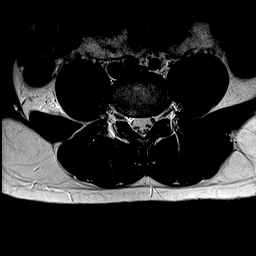
[im 13/37]
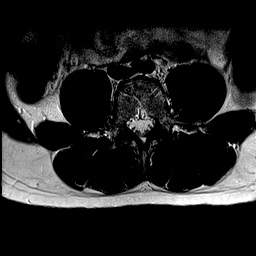
[im 15/37]
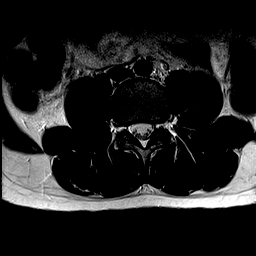
[im 17/37]
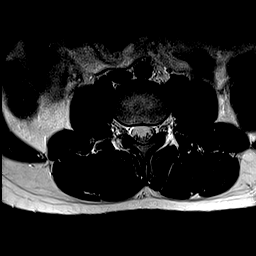
[im 20/37]
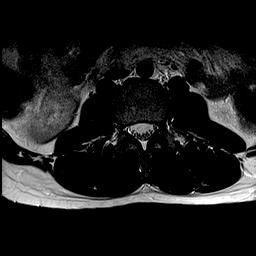
[im 22/37]
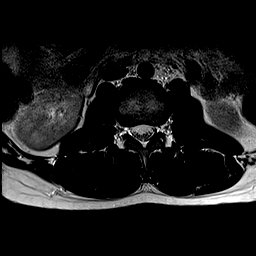
[im 25/37]
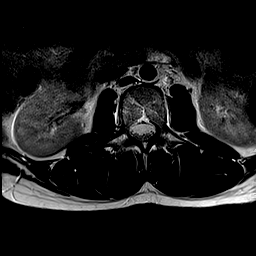
[im 27/37]
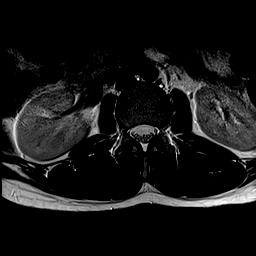
[im 29/37]
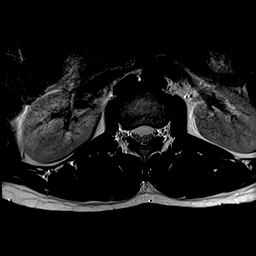
[im 32/37]
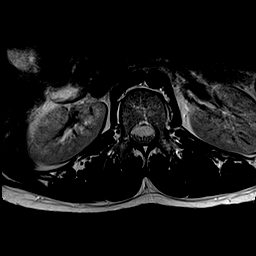
[im 34/37]
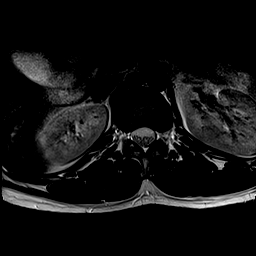
[im 37/37]
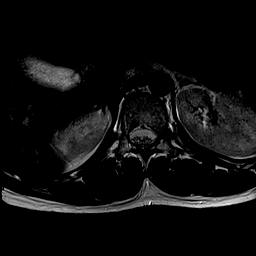

[47 of 48 positions shown; findings below may reference images not displayed]

FINDINGS: Segmentation:  5 lumbar type vertebral bodies.

Alignment:  Normal

Vertebrae:  Normal

Conus medullaris and cauda equina: Conus extends to the T12-L1
level. Conus and cauda equina appear normal.

Paraspinal and other soft tissues: Normal

Disc levels:

No abnormality at L4-5 or above.

At L5-S1, there is a central to right-sided disc herniation that
contacts and displaces the right S1 nerve.
IMPRESSION: Single level abnormality at L5-S1. Right posterolateral disc
herniation contacting and displacing the right S1 nerve.

## 7720-11-10 DEATH — deceased
